# Patient Record
Sex: Female | Born: 2000 | Race: Black or African American | Hispanic: No | Marital: Single | Smoking: Never smoker
Health system: Southern US, Community
[De-identification: ages and names within clinical notes are randomized; demographics above are authoritative.]

## PROBLEM LIST (undated history)

## (undated) DIAGNOSIS — Z789 Other specified health status: Secondary | ICD-10-CM

## (undated) HISTORY — PX: NO PAST SURGERIES: SHX2092

---
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
## 2021-04-24 ENCOUNTER — Emergency Department: Payer: Medicaid Other

## 2021-04-24 ENCOUNTER — Other Ambulatory Visit: Payer: Self-pay

## 2021-04-24 ENCOUNTER — Emergency Department
Admission: EM | Admit: 2021-04-24 | Discharge: 2021-04-24 | Disposition: A | Discharge status: Home / Self Care | Payer: Medicaid Other | Attending: Emergency Medicine | Admitting: Emergency Medicine

## 2021-04-24 DIAGNOSIS — M542 Cervicalgia: Secondary | ICD-10-CM | POA: Insufficient documentation

## 2021-04-24 DIAGNOSIS — Y92410 Unspecified street and highway as the place of occurrence of the external cause: Secondary | ICD-10-CM | POA: Insufficient documentation

## 2021-04-24 DIAGNOSIS — M545 Low back pain, unspecified: Secondary | ICD-10-CM | POA: Insufficient documentation

## 2021-04-24 LAB — POC URINE PREG, ED: Preg Test, Ur: NEGATIVE

## 2021-04-24 MED ORDER — METHOCARBAMOL 500 MG PO TABS: 500.0000 mg | ORAL_TABLET | Freq: Three times a day (TID) | ORAL | 0 refills | Status: DC | PRN

## 2021-04-24 MED ORDER — METHOCARBAMOL 500 MG PO TABS: 500.0000 mg | ORAL_TABLET | Freq: Three times a day (TID) | ORAL | 0 refills | Status: AC | PRN

## 2021-04-24 MED ORDER — MELOXICAM 15 MG PO TABS: 15.0000 mg | ORAL_TABLET | Freq: Every day | ORAL | 2 refills | Status: AC

## 2021-04-24 MED ORDER — MELOXICAM 15 MG PO TABS: 15.0000 mg | ORAL_TABLET | Freq: Every day | ORAL | 2 refills | Status: DC

## 2021-04-24 NOTE — Discharge Instructions (Signed)
Take Meloxicam and Robaxin as directed.  
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
## 2023-07-10 DIAGNOSIS — Z113 Encounter for screening for infections with a predominantly sexual mode of transmission: Secondary | ICD-10-CM | POA: Diagnosis not present

## 2023-07-10 DIAGNOSIS — Z3201 Encounter for pregnancy test, result positive: Secondary | ICD-10-CM | POA: Diagnosis not present

## 2023-07-10 DIAGNOSIS — Z8759 Personal history of other complications of pregnancy, childbirth and the puerperium: Secondary | ICD-10-CM | POA: Diagnosis not present

## 2023-07-31 ENCOUNTER — Emergency Department
Admission: EM | Admit: 2023-07-31 | Discharge: 2023-07-31 | Discharge status: Against Medical Advice | Payer: No Typology Code available for payment source | Source: Home / Self Care

## 2023-07-31 ENCOUNTER — Encounter: Payer: Self-pay | Admitting: *Deleted

## 2023-07-31 ENCOUNTER — Other Ambulatory Visit: Payer: Self-pay

## 2023-07-31 DIAGNOSIS — Y93G3 Activity, cooking and baking: Secondary | ICD-10-CM | POA: Insufficient documentation

## 2023-07-31 DIAGNOSIS — S61412A Laceration without foreign body of left hand, initial encounter: Secondary | ICD-10-CM | POA: Diagnosis not present

## 2023-07-31 DIAGNOSIS — Z5321 Procedure and treatment not carried out due to patient leaving prior to being seen by health care provider: Secondary | ICD-10-CM | POA: Insufficient documentation

## 2023-07-31 DIAGNOSIS — W269XXA Contact with unspecified sharp object(s), initial encounter: Secondary | ICD-10-CM | POA: Diagnosis not present

## 2023-07-31 NOTE — ED Triage Notes (Signed)
Pt was chopping greens and cut the tip of the left hand second finger. Small amount of bleeding in triage, new dressing applied. Unknown last tetanus.

## 2024-09-28 DIAGNOSIS — Z124 Encounter for screening for malignant neoplasm of cervix: Secondary | ICD-10-CM | POA: Diagnosis not present

## 2024-09-28 DIAGNOSIS — Z3201 Encounter for pregnancy test, result positive: Secondary | ICD-10-CM | POA: Diagnosis not present

## 2024-09-28 DIAGNOSIS — Z3480 Encounter for supervision of other normal pregnancy, unspecified trimester: Secondary | ICD-10-CM | POA: Diagnosis not present

## 2024-09-28 DIAGNOSIS — O26841 Uterine size-date discrepancy, first trimester: Secondary | ICD-10-CM | POA: Diagnosis not present

## 2024-11-02 DIAGNOSIS — Z3482 Encounter for supervision of other normal pregnancy, second trimester: Secondary | ICD-10-CM | POA: Diagnosis not present

## 2024-11-30 DIAGNOSIS — Z363 Encounter for antenatal screening for malformations: Secondary | ICD-10-CM | POA: Diagnosis not present

## 2024-11-30 DIAGNOSIS — Z3A21 21 weeks gestation of pregnancy: Secondary | ICD-10-CM | POA: Diagnosis not present

## 2024-11-30 DIAGNOSIS — Z3480 Encounter for supervision of other normal pregnancy, unspecified trimester: Secondary | ICD-10-CM | POA: Diagnosis not present
